# Patient Record
Sex: Male | Born: 1965 | Race: White | Hispanic: No | Marital: Single
Health system: Midwestern US, Academic
[De-identification: ages and names within clinical notes are randomized; demographics above are authoritative.]

---

## 2018-09-20 ENCOUNTER — Encounter: Admit: 2018-09-20 | Discharge: 2018-09-20

## 2018-09-20 ENCOUNTER — Encounter: Admit: 2018-09-20 | Discharge: 2018-09-21

## 2018-09-20 ENCOUNTER — Ambulatory Visit
Admit: 2018-09-21 | Discharge: 2018-09-23 | Disposition: A | Discharge status: Home / Self Care | Source: Other Acute Inpatient Hospital | Attending: Critical Care Medicine | Admitting: Critical Care Medicine

## 2018-09-20 DIAGNOSIS — S82201A Unspecified fracture of shaft of right tibia, initial encounter for closed fracture: Secondary | ICD-10-CM

## 2018-09-20 DIAGNOSIS — S0101XA Laceration without foreign body of scalp, initial encounter: Secondary | ICD-10-CM

## 2018-09-20 MED ORDER — ONDANSETRON HCL (PF) 4 MG/2 ML IJ SOLN
4 mg | INTRAVENOUS | 0 refills | Status: DC | PRN
Start: 2018-09-20 — End: 2018-09-23
  Administered 2018-09-21 – 2018-09-23 (×3): 4 mg via INTRAVENOUS

## 2018-09-20 MED ORDER — ENOXAPARIN 30 MG/0.3 ML SC SYRG
30 mg | Freq: Two times a day (BID) | SUBCUTANEOUS | 0 refills | Status: DC
Start: 2018-09-20 — End: 2018-09-23
  Administered 2018-09-21 – 2018-09-23 (×6): 30 mg via SUBCUTANEOUS

## 2018-09-20 MED ORDER — SENNOSIDES 8.6 MG PO TAB
1 | Freq: Two times a day (BID) | ORAL | 0 refills | Status: DC
Start: 2018-09-20 — End: 2018-09-23
  Administered 2018-09-21 – 2018-09-23 (×4): 1 via ORAL

## 2018-09-20 MED ORDER — ALBUTEROL SULFATE 90 MCG/ACTUATION IN HFAA
2 | RESPIRATORY_TRACT | 0 refills | Status: DC | PRN
Start: 2018-09-20 — End: 2018-09-23

## 2018-09-20 MED ORDER — FENTANYL CITRATE (PF) 50 MCG/ML IJ SOLN
25-50 ug | INTRAVENOUS | 0 refills | Status: DC | PRN
Start: 2018-09-20 — End: 2018-09-22
  Administered 2018-09-21: 15:00:00 50 ug via INTRAVENOUS
  Administered 2018-09-21: 11:00:00 25 ug via INTRAVENOUS
  Administered 2018-09-21 (×2): 50 ug via INTRAVENOUS
  Administered 2018-09-21 (×2): 25 ug via INTRAVENOUS

## 2018-09-20 MED ORDER — ACETAMINOPHEN 325 MG PO TAB
650 mg | ORAL | 0 refills | Status: DC | PRN
Start: 2018-09-20 — End: 2018-09-23
  Administered 2018-09-22 (×2): 650 mg via ORAL

## 2018-09-20 MED ORDER — MELATONIN 3 MG PO TAB
3 mg | Freq: Every evening | ORAL | 0 refills | Status: DC | PRN
Start: 2018-09-20 — End: 2018-09-23

## 2018-09-20 MED ORDER — LIDOCAINE-EPINEPHRINE 1 %-1:100,000 IJ SOLN
30 mL | Freq: Once | INTRAMUSCULAR | 0 refills | Status: CP
Start: 2018-09-20 — End: ?
  Administered 2018-09-21: 03:00:00 30 mL via INTRAMUSCULAR

## 2018-09-20 MED ORDER — FENTANYL CITRATE (PF) 50 MCG/ML IJ SOLN
25 ug | Freq: Once | INTRAVENOUS | 0 refills | Status: CP
Start: 2018-09-20 — End: ?
  Administered 2018-09-21: 05:00:00 25 ug via INTRAVENOUS

## 2018-09-20 MED ORDER — AMPICILLIN/SULBACTAM 3G/100ML NS IVPB (MB+)
3 g | INTRAVENOUS | 0 refills | Status: DC
Start: 2018-09-20 — End: 2018-09-21
  Administered 2018-09-21 (×6): 3 g via INTRAVENOUS

## 2018-09-20 MED ORDER — ONDANSETRON HCL 4 MG PO TAB
4 mg | ORAL | 0 refills | Status: DC | PRN
Start: 2018-09-20 — End: 2018-09-23

## 2018-09-20 MED ORDER — POLYETHYLENE GLYCOL 3350 17 GRAM PO PWPK
1 | Freq: Every day | ORAL | 0 refills | Status: DC | PRN
Start: 2018-09-20 — End: 2018-09-23
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
## 2018-09-21 ENCOUNTER — Encounter: Admit: 2018-09-21 | Discharge: 2018-09-21 | Attending: Critical Care Medicine | Admitting: Critical Care Medicine

## 2018-09-21 ENCOUNTER — Encounter: Admit: 2018-09-21 | Discharge: 2018-09-21

## 2018-09-21 ENCOUNTER — Encounter: Admit: 2018-09-20 | Discharge: 2018-09-20 | Attending: Critical Care Medicine | Admitting: Critical Care Medicine

## 2018-09-21 DIAGNOSIS — S82201B Unspecified fracture of shaft of right tibia, initial encounter for open fracture type I or II: Principal | ICD-10-CM

## 2018-09-21 LAB — COMPREHENSIVE METABOLIC PANEL
Lab: 141 MMOL/L (ref 137–147)
Lab: 20 MMOL/L — ABNORMAL LOW (ref 21–30)
Lab: 21 U/L (ref 7–56)
Lab: 31 U/L (ref 7–40)
Lab: >60 mL/min (ref >60)
Lab: >60 mL/min (ref >60)
Lab: 11 (ref 3–12)

## 2018-09-21 LAB — BASIC METABOLIC PANEL: Lab: 142 MMOL/L — ABNORMAL LOW (ref 137–147)

## 2018-09-21 LAB — CBC
Lab: 13 % — ABNORMAL HIGH (ref 11–15)
Lab: 15 K/UL — ABNORMAL HIGH (ref 4.5–11.0)
Lab: 247 10*3/uL (ref 150–400)
Lab: 8.8 FL (ref 7–11)
Lab: 8 K/UL — ABNORMAL LOW (ref 4.5–11.0)

## 2018-09-21 LAB — PHOSPHORUS
Lab: 2.9 mg/dL (ref 2.0–4.5)
Lab: 3.1 mg/dL (ref 2.0–4.5)

## 2018-09-21 LAB — PROTIME INR (PT): Lab: 1.2 M/UL (ref 0.8–1.2)

## 2018-09-21 LAB — LACTIC ACID(LACTATE): Lab: 1.1 MMOL/L (ref 0.5–2.0)

## 2018-09-21 LAB — CREATINE KINASE-CPK: Lab: 446 U/L — ABNORMAL HIGH (ref 35–232)

## 2018-09-21 LAB — MAGNESIUM
Lab: 2.1 mg/dL (ref 1.6–2.6)
Lab: 1.8 mg/dL — ABNORMAL HIGH (ref >60)

## 2018-09-21 LAB — COVID-19 (SARS-COV-2) PCR

## 2018-09-21 LAB — IONIZED CALCIUM: Lab: 1 MMOL/L — ABNORMAL HIGH (ref 1.0–1.3)

## 2018-09-21 LAB — PTT (APTT): Lab: 28 s (ref 24.0–36.5)

## 2018-09-21 MED ORDER — FAMOTIDINE 20 MG PO TAB
20 mg | Freq: Every day | ORAL | 0 refills | Status: DC
Start: 2018-09-21 — End: 2018-09-23
  Administered 2018-09-21 – 2018-09-23 (×3): 20 mg via ORAL

## 2018-09-21 MED ORDER — PHENYLEPHRINE IN 0.9% NACL(PF) 1 MG/10 ML (100 MCG/ML) IV SYRG
INTRAVENOUS | 0 refills | Status: DC
Start: 2018-09-21 — End: 2018-09-21
  Administered 2018-09-21 (×8): 100 ug via INTRAVENOUS
  Administered 2018-09-21: 20:00:00 50 ug via INTRAVENOUS

## 2018-09-21 MED ORDER — POTASSIUM CHLORIDE IN WATER 10 MEQ/50 ML IV PGBK
10 meq | INTRAVENOUS | 0 refills | Status: DC
Start: 2018-09-21 — End: 2018-09-21
  Administered 2018-09-21: 12:00:00 10 meq via INTRAVENOUS

## 2018-09-21 MED ORDER — DEXTRAN 70-HYPROMELLOSE (PF) 0.1-0.3 % OP DPET
0 refills | Status: DC
Start: 2018-09-21 — End: 2018-09-21
  Administered 2018-09-21: 19:00:00 2 [drp] via OPHTHALMIC

## 2018-09-21 MED ORDER — VANCOMYCIN 1,000 MG IV SOLR
0 refills | Status: DC
Start: 2018-09-21 — End: 2018-09-21
  Administered 2018-09-21: 22:00:00 1 g via TOPICAL

## 2018-09-21 MED ORDER — PROPOFOL INJ 10 MG/ML IV VIAL
0 refills | Status: DC
Start: 2018-09-21 — End: 2018-09-21
  Administered 2018-09-21: 19:00:00 100 mg via INTRAVENOUS

## 2018-09-21 MED ORDER — CEFAZOLIN 1 GRAM IJ SOLR
0 refills | Status: DC
Start: 2018-09-21 — End: 2018-09-21
  Administered 2018-09-21: 19:00:00 2 g via INTRAVENOUS

## 2018-09-21 MED ORDER — SUCCINYLCHOLINE CHLORIDE 20 MG/ML IJ SOLN
INTRAVENOUS | 0 refills | Status: DC
Start: 2018-09-21 — End: 2018-09-21
  Administered 2018-09-21: 19:00:00 120 mg via INTRAVENOUS

## 2018-09-21 MED ORDER — HYDROMORPHONE (PF) 2 MG/ML IJ SYRG
0 refills | Status: DC
Start: 2018-09-21 — End: 2018-09-21
  Administered 2018-09-21 (×3): 0.5 mg via INTRAVENOUS
  Administered 2018-09-21 (×2): .25 mg via INTRAVENOUS

## 2018-09-21 MED ORDER — LACTATED RINGERS IV SOLP
INTRAVENOUS | 0 refills | Status: AC
Start: 2018-09-21 — End: ?
  Administered 2018-09-21 (×3): 1000.000 mL via INTRAVENOUS

## 2018-09-21 MED ORDER — HYDROMORPHONE (PF) 2 MG/ML IJ SYRG
.5 mg | Freq: Once | INTRAVENOUS | 0 refills | Status: DC
Start: 2018-09-21 — End: 2018-09-22

## 2018-09-21 MED ORDER — DEXAMETHASONE SODIUM PHOSPHATE 4 MG/ML IJ SOLN
INTRAVENOUS | 0 refills | Status: DC
Start: 2018-09-21 — End: 2018-09-21
  Administered 2018-09-21: 21:00:00 4 mg via INTRAVENOUS

## 2018-09-21 MED ORDER — SUGAMMADEX 100 MG/ML IV SOLN
INTRAVENOUS | 0 refills | Status: DC
Start: 2018-09-21 — End: 2018-09-21
  Administered 2018-09-21: 22:00:00 165 mg via INTRAVENOUS

## 2018-09-21 MED ORDER — DEXMEDETOMIDINE# 4MCG/ML IV SOLN
0 refills | Status: DC
Start: 2018-09-21 — End: 2018-09-21
  Administered 2018-09-21: 22:00:00 4 ug via INTRAVENOUS
  Administered 2018-09-21: 22:00:00 8 ug via INTRAVENOUS

## 2018-09-21 MED ORDER — AMPICILLIN/SULBACTAM 3G/100ML NS IVPB (MB+)
3 g | INTRAVENOUS | 0 refills | Status: CP
Start: 2018-09-21 — End: ?
  Administered 2018-09-22 (×8): 3 g via INTRAVENOUS

## 2018-09-21 MED ORDER — PROMETHAZINE 25 MG/ML IJ SOLN
6.25 mg | INTRAVENOUS | 0 refills | Status: DC | PRN
Start: 2018-09-21 — End: 2018-09-22

## 2018-09-21 MED ORDER — LIDOCAINE (PF) 200 MG/10 ML (2 %) IJ SYRG
0 refills | Status: DC
Start: 2018-09-21 — End: 2018-09-21
  Administered 2018-09-21: 19:00:00 50 mg via INTRAVENOUS

## 2018-09-21 MED ORDER — ACETAMINOPHEN 500 MG PO TAB
1000 mg | Freq: Once | ORAL | 0 refills | Status: CP
Start: 2018-09-21 — End: ?
  Administered 2018-09-21: 1000 mg via ORAL

## 2018-09-21 MED ORDER — CALCIUM GLUCONATE 1GM/100ML NS MB+
1 g | Freq: Once | INTRAVENOUS | 0 refills | Status: CP
Start: 2018-09-21 — End: ?
  Administered 2018-09-21 (×2): 1 g via INTRAVENOUS

## 2018-09-21 MED ORDER — OXYCODONE 5 MG PO TAB
5-10 mg | ORAL | 0 refills | Status: DC | PRN
Start: 2018-09-21 — End: 2018-09-21
  Administered 2018-09-21: 14:00:00 5 mg via ORAL

## 2018-09-21 MED ORDER — FENTANYL CITRATE (PF) 50 MCG/ML IJ SOLN
25 ug | INTRAVENOUS | 0 refills | Status: DC | PRN
Start: 2018-09-21 — End: 2018-09-22
  Administered 2018-09-21 (×4): 25 ug via INTRAVENOUS

## 2018-09-21 MED ORDER — MIDAZOLAM 1 MG/ML IJ SOLN
INTRAVENOUS | 0 refills | Status: DC
Start: 2018-09-21 — End: 2018-09-21
  Administered 2018-09-21: 18:00:00 2 mg via INTRAVENOUS

## 2018-09-21 MED ORDER — DEXAMETHASONE SODIUM PHOSPHATE 4 MG/ML IJ SOLN
4 mg | Freq: Once | INTRAVENOUS | 0 refills | Status: DC | PRN
Start: 2018-09-21 — End: 2018-09-22

## 2018-09-21 MED ORDER — MEPERIDINE (PF) 25 MG/ML IJ SYRG
12.5 mg | INTRAVENOUS | 0 refills | Status: DC | PRN
Start: 2018-09-21 — End: 2018-09-22

## 2018-09-21 MED ORDER — MAGNESIUM SULFATE IN WATER 4 GRAM/50 ML (8 %) IV PGBK
4 g | Freq: Once | INTRAVENOUS | 0 refills | Status: CP
Start: 2018-09-21 — End: ?
  Administered 2018-09-21: 12:00:00 4 g via INTRAVENOUS

## 2018-09-21 MED ORDER — ROCURONIUM 10 MG/ML IV SOLN
INTRAVENOUS | 0 refills | Status: DC
Start: 2018-09-21 — End: 2018-09-21
  Administered 2018-09-21 (×3): 10 mg via INTRAVENOUS
  Administered 2018-09-21: 19:00:00 40 mg via INTRAVENOUS

## 2018-09-21 MED ORDER — OXYCODONE 5 MG PO TAB
5-15 mg | ORAL | 0 refills | Status: DC | PRN
Start: 2018-09-21 — End: 2018-09-23
  Administered 2018-09-21 (×2): 5 mg via ORAL
  Administered 2018-09-22 (×4): 10 mg via ORAL
  Administered 2018-09-23 (×3): 15 mg via ORAL

## 2018-09-21 MED ORDER — OXYCODONE 5 MG PO TAB
5-10 mg | Freq: Once | ORAL | 0 refills | Status: CP | PRN
Start: 2018-09-21 — End: ?
  Administered 2018-09-21: 23:00:00 10 mg via ORAL

## 2018-09-21 MED ORDER — POTASSIUM CHLORIDE 20 MEQ PO TBTQ
40 meq | Freq: Once | ORAL | 0 refills | Status: CP
Start: 2018-09-21 — End: ?
  Administered 2018-09-21: 15:00:00 40 meq via ORAL

## 2018-09-21 NOTE — Progress Notes
Brief Ortho Update Note    - OR today for IMN R tibia  - posted, consented, marked  - NPO  - continues to be comfortable on exam        Calvin Morris  [id number]

## 2018-09-21 NOTE — Patient Instructions
How Your Back Works  A healthy back lets you bend and stretch without pain. The spine has???3 natural curves. These keep your body balanced. Strong, flexible muscles support your spine. Soft, cushioning disks separate the hard bones of your spine. The disks let your spine???bend and move.    The parts of the spine  ??? The vertebrae are the 24 bones that make up the spine.  ??? The spinous process is the part of each vertebra you can feel through your skin.  ??? Each of these bones has a central hole that runs top to bottom. Together these holes form a tunnel called the spinal canal.  ??? The lamina of each vertebra forms the back of the spinal canal.  ??? Running through the canal are nerves. They are attached in a bundle called the spinal cord for most of the canal.  ??? A foramen is a small opening where a spinal???nerve root leaves the spinal canal.  ??? Disks serve as cushions between vertebrae. A disk???s soft center absorbs shock during movement.     Two vertebrae and a disk     The supporting muscles  Strong, flexible muscles help maintain your 3 natural curves. They hold your spine in???correct alignment. This helps support your upper body. Strong core muscles help take the strain off your back. These include the???stomach, buttock, and thigh muscles.  StayWell last reviewed this educational content on 02/29/2016  ??? 2000-2020 [employer], [place]. [address], [place], [place] [address]. All rights reserved. This information is not intended as a substitute for professional medical care. Always follow your healthcare professional's instructions.        How Bones Heal  Bone is living tissue made up of cells. When a bone breaks, cells in the blood rush to the fractured area. These cells help grow???new bone. Bones heal through a gradual process called remodeling. The length of this process depends on general health, age, the type of fracture and how well the injury is cared for. The new bone grows stronger, even after a cast is removed. The fracture callus shrinks and remodels as the bone is used.      The fibers are replaced by new bone. At first, the new bone is weak and spongy. This is called a fracture callus.      Cells form a network of strong fibers inside the blood clot. These fibers hold bone fragments together.      Tissues bleed around the fracture. This forms a blood clot in the space between bone fragments.   StayWell last reviewed this educational content on 06/28/2016  ??? 2000-2020 [employer], [place]. [address] [place], [place] [address]. All rights reserved. This information is not intended as a substitute for professional medical care. Always follow your healthcare professional's instructions.        Laceration, All Closures  A???laceration is a cut through the skin. This will usually require stitches or staples if it's deep. Minor cuts may be treated with a surgical tape closure or???skin glue.     Home care  ??? Your healthcare provider may prescribe an antibiotic. This is to help prevent infection. Follow all instructions for taking this medicine. Take the medicine every day until it's gone or you are told to stop. You should not have any left over.  ??? The healthcare provider may prescribe medicines for pain. If no pain medicines were prescribed, you can use over-the-counter pain medicines. Follow instructions for taking any pain  medicines. Talk with your healthcare provider before using these medicines if you have chronic liver or kidney disease, or ever had a stomach ulcer or digestive bleeding.  ??? Follow the healthcare provider???s instructions on how to care for the cut.  ??? Keep the wound clean and dry. Don't get the wound wet until you are told it's OK to do so.???If the area gets wet, gently pat it dry with a clean cloth. Replace the wet bandage with a dry one.  ??? If a bandage was applied and it becomes wet or dirty, replace it. Otherwise, leave it in place for the first 24 hours.  ??? Caring for stitches or staples: Once you no longer need to keep them dry, clean the wound daily. First, remove the bandage. Then wash the area gently with soap and clean running water, or as directed by the???healthcare provider. Use a wet cotton swab to loosen and remove any blood or crust that forms. After cleaning, apply a thin layer of antibiotic ointment if advised. Then put on a new bandage unless you are told not to.  ??? Caring for skin glue: Don???t put apply liquid, ointment, or cream on the wound while the glue is in place.???Don't do activities that cause heavy sweating. Protect the wound from sunlight.???Don't scratch, rub, or pick at the adhesive film. Don't place tape directly over the film.???The glue should peel off naturally within 5 to 10 days.???  ??? Caring for surgical tape: Keep the area dry. If it gets wet, blot it dry with a clean towel. Surgical tape usually falls off within 7 to 10 days. If it has not fallen off after 10 days, you can take it off yourself. Put mineral oil or petroleum jelly on a cotton ball and gently rub the tape until it's removed.  ??? Once you can get the wound wet, you may shower as usual but don't soak the wound in water (no tub baths or swimming).  ??? Even with proper treatment, a wound infection may sometimes occur. Check the wound daily for signs of infection listed below.  Scalp wounds  During the first 2 days, you may carefully rinse your hair in the shower to remove blood, glass or dirt particles. After 2 days, you may shower and shampoo your hair normally. Don't soak your scalp in the tub or go swimming until the stitches or staples have been removed. Talk with your healthcare provider before applying any antibiotic ointment to the wound.   Mouth wounds  Eat soft foods to reduce pain. If the cut is inside of your mouth, clean by rinsing after each meal and at bedtime with a mixture of equal parts water and hydrogen peroxide (don't swallow!). Or you can use a cotton swab to directly apply hydrogen peroxide onto the cut. You may also be prescribed a chlorhexidine solution to rinse with. Mouth wounds can be painful when eating. You may use an over-the-counter local numbing solution for pain relief. If this is not available, you may use any numbing solution intended for teething babies. You may apply this directly to the sores with a cotton-tip swab or with your clean finger.   Follow-up care  Follow up with your healthcare provider as advised.???Ask your healthcare provider how long stitches should be left in place. Be sure to return for stitch removal as directed. If dissolving stitches were used in the mouth, these should fall out or dissolve without the need for removal. If tape closures were used, remove them yourself  when your provider recommends if they have not fallen off on their own. If???skin glue was used, the film will wear off by itself. Generally, you should keep healing wounds out of direct sunlight for the first couple of months to try to lessen scarring.   When to seek medical advice  Call your healthcare provider right away???if any of these occur:  ??? Signs of infection, including increasing pain in the wound, increasing wound redness or swelling, or pus or bad odor coming from the wound  ??? Fever of???100.4???F (38.???C)???or higher , or as directed by your healthcare provider  ??? Stitches or staples come apart or fall out or surgical tape falls off before 7 days and the wound appears to be reopening  ??? Wound edges reopen  ??? Wound changes colors  ??? Numbness around the wound after any numbing medicine should have worn off  ??? Decreased movement around the injured area  Call 911  Call 911???if you can't control the wound bleeding???with direct pressure.   StayWell last reviewed this educational content on 09/28/2017  ??? 2000-2020 [employer], [place]. [address], [place], [place] [address]. All rights reserved. This information is not intended as a substitute for professional medical care. Always follow your healthcare professional's instructions.        Leg Fracture    You have a break (fracture) of the leg. A fracture is treated with a splint, cast, or special boot. It will usually take at about 8 to 12 weeks for the fracture to heal, but it can take several months in some cases. If you have a severe injury, you may need surgery to fix it.  Home care  Follow these guidelines when caring for yourself at home:  ??? You will be given a splint, cast, boot, or other device to keep the injured area from moving. Unless you were told otherwise, use crutches or a walker. Don???t put weight on the injured leg until your healthcare provider says you can do so. (You can rent crutches and a walker at many pharmacies and surgical or orthopedic supply stores.)  ??? Keep your leg elevated to reduce pain and swelling. When sleeping, put a pillow under the injured leg. When sitting, support the injured leg so it is above your heart. This is very important during the first 2 days (48 hours).  ??? Put an ice pack on the injured area. Do this for 20 minutes every 1 to 2 hours the first day for pain relief. You can make an ice pack by wrapping a plastic bag of ice cubes in a thin towel. As the ice melts, be careful that the cast, splint, or boot doesn???t get wet. You can put the ice pack directly over the splint or cast. Continue using the ice pack 3 to 4 times a day for the next 2 days. Then use the ice pack as needed to ease pain and swelling.  ??? Keep the cast, splint, or boot completely dry at all times. Bathe with your cast, splint, or boot out of the water. Protect it with a large plastic bag, rubber-banded or taped at the top end. If a boot or fiberglass cast or splint gets wet, you can dry it with a hair dryer.  ??? You may use acetaminophen or ibuprofen to control pain, unless another pain medicine was prescribed. If you have chronic liver or kidney disease, talk with your healthcare provider before using these medicines. Also talk with your provider if you???ve  had a stomach ulcer or gastrointestinal bleeding.  ??? Don???t put creams or objects under the cast if you have itching.  Follow-up care  Follow up with your healthcare provider as advised. This is to make sure the bone is healing the way it should. If a splint was put on, it may be converted to a cast at your next visit.  X-rays may be taken. You will be told of any new findings that may affect your care.  When to seek medical advice  Call your healthcare provider right away if any of these occur:  ??? The cast or splint cracks  ??? The plaster cast or splint becomes wet or soft  ??? The fiberglass cast or splint stays wet for more than 24 hours  ??? Bad odor from the cast or wound fluid stains the cast  ??? Tightness or pain under the cast or splint gets worse  ??? Toes become swollen, cold, blue, numb, or tingly  ??? You can???t move your toes  ??? Skin around cast or splint becomes red or irritated  ??? Fever of 100.4???F (38???C) or higher, chills oras directed by your healthcare provider  StayWell last reviewed this educational content on 04/01/2015  ??? 2000-2020 [employer], [place]. [address] [place], [place] [address]. All rights reserved. This information is not intended as a substitute for professional medical care. Always follow your healthcare professional's instructions.        Medicine for Pain  Medicines can help to block pain, decrease inflammation, and treat related problems. More than???one medicine may be used???to treat your pain. Medicines may be changed as you feel better, or if they cause side effects.   Medicines Examples What they do Possible side effects   Non-opioid NSAIDs aspirin, acetaminophen Reduce pain chemicals at the site of pain.???NSAIDs can???reduce joint and soft tissue inflammation.  Nausea, stomach pain, ulcers, indigestion, bleeding, kidney, and liver problems. Certain NSAIDs may increase the risk for cardiovascular disease in some people. Talk with your healthcare provider.    Opioids  morphine and similar medicines, often called narcotics  Reduce feelings or perception of pain. Used for moderate to severe pain.  Nausea, vomiting, itching,???drowsiness, constipation, slowed breathing    Other medicines  corticosteroids, antinausea, antidepressants, antiseizure medicines  Reduce swelling, burning or tingling pain, or???certain side effects of pain medicines, such as nausea or vomiting  Your healthcare provider will explain the possible side effects of these medicines.    Anesthetics (local, injected)  lidocaine, benzocaine, and medicines used by anesthesiologists  Stop pain signals from reaching the brain by blocking feeling in the treated area  Nausea, low blood pressure, fever, slowed breathing, fainting, seizures, heart attack    When to call your healthcare provider  Call your healthcare provider right away (or have a family member call) if you have:  ??? Unrelieved pain  ??? Side effects, including constipation or uncontrolled nausea, that interfere with daily activities  If you have extreme sleepiness or breathing problems, call 911.   Other precautions  ??? Ask your healthcare provider or pharmacist how to get rid of your pain medicines safely when you stop using them.  ??? Never share your pain medicines with anyone.  ??? Store your medicines in a safe place so they can???t be stolen. If you think your medicine has been stolen or lost, tell your healthcare provider right away.    StayWell last reviewed this educational content on 12/29/2017  ??? 2000-2020 [employer], [place]. [address], [place] [address]. All rights reserved. This information is not intended as a substitute for professional medical care. Always follow your healthcare professional's instructions.

## 2018-09-21 NOTE — Other
Brief Operative Note    Name: Calvin Morris is a 53 y.o. male     DOB: [birth year]             MRN#: [id number]  DATE OF OPERATION: 09/21/2018    Date:  09/21/2018        Preoperative Dx:   Closed fracture of right tibia and fibula, initial encounter [S82.201A, S82.401A]    Post-op Diagnosis      * Closed fracture of right tibia and fibula, initial encounter [S82.201A, S82.401A]    Procedure(s) (LRB):  TREATMENT TIBIAL SHAFT FRACTURE WITH INTRAMEDULLARY IMPLANT WITH INTERLOCKING SCREWS/ CERCLAGE (Right)    Anesthesia Type: General    Surgeon(s) and Role:     * Ree Shay, MD - Primary     * Frances Maywood, MD - Resident - Assisting     * Mersereau, Benancio Deeds, DO - Resident - Assisting      Findings:  Comminuted tibial shaft fx    Estimated Blood Loss: 200 ml    Specimen(s) Removed/Disposition: * No specimens in log *    Complications:  None    Implants:   Implant Name Type Inv. Item Serial No. Manufacturer Lot No. LRB No. Used   NAIL 35CM TIBIAL TRIGEN META-NAIL INTRAMEDULLARY - SNA Nail NAIL 35CM TIBIAL TRIGEN META-NAIL INTRAMEDULLARY NA [employer] [id number] Right 1   GRAFT BONE  INFUSE SMALL SPINE BOVINE COLLAGEN - SNA  GRAFT BONE  INFUSE SMALL SPINE BOVINE COLLAGEN NA [employer] [id number] Right 1   SCREW BONE  FEMUR LOW PROFILE INTERNAL HEX TRIGEN - SNA Screw SCREW BONE  FEMUR LOW PROFILE INTERNAL HEX TRIGEN NA [employer] [id number] Right 1   SCREW BONE 52. FEMUR LOW PROFILE INTERNAL HEX TRIGEN - SNA Screw SCREW BONE 52. FEMUR LOW PROFILE INTERNAL HEX TRIGEN NA [employer] [id number] Right 1   SCREW BONE  FEMUR LOW PROFILE INTERNAL HEX TRIGEN - SNA Screw SCREW BONE  FEMUR LOW PROFILE INTERNAL HEX TRIGEN NA [employer] [id number] Right 1   SCREW BONE 4.3MM 27.5MM TIBIA LOW PROFILE INTERNAL HEX - SNA Screw SCREW BONE 4.3MM 27.5MM TIBIA LOW PROFILE INTERNAL HEX NA [employer] [id number] Right 1   SCREW BONE 2.7MM 4.5MM T7 CORTEX SELF RETAINING - SNA Screw SCREW BONE 2.7MM 4.5MM T7 CORTEX SELF RETAINING NA [employer] NA Right 1   SCREW BONE 2.7MM 4.5MM T7 CORTEX SELF RETAINING - SNA Screw SCREW BONE 2.7MM 4.5MM T7 CORTEX SELF RETAINING NA [employer] NA Right 1   3.5MM LOCKING 1/3 TUBULAR PLATE, 6HOLE Plate  NA [employer] ORTHO NA Right 1         Drains: None    Disposition:  PACU - stable    Frances Maywood, MD  Pager

## 2018-09-21 NOTE — Progress Notes
Patient arrived to room # (2655) via cart accompanied by EMS. Patient transferred to the bed with assistance. Bedside safety checks completed. Initial patient assessment completed. Refer to flowsheet for details.    Admission skin assessment completed with: Cummiskey, MD    Pressure injury present on arrival?: No    1. Head/Face/Neck: No  2. Trunk/Back: No  3. Upper Extremities: No  4. Lower Extremities: No  5. Pelvic/Coccyx: No  6. Assessed for device associated injury? Yes  7. Malnutrition Screening Tool (Nursing Nutrition Assessment) Completed? Yes    See Doc Flowsheet for additional wound details.     INTERVENTIONS:     q2h turns

## 2018-09-21 NOTE — Progress Notes
ORTHOTICS/PROSTHETICS  Consult Note:      NAME: Calvin Morris  ADMISSION DATE: admitted to hospital  DOB: January 31, 1966  AGE: 53 y.o.  ROOM: [id number]/04  DOCTOR:     Date of Order: 09/21/18  Date of Service: 09/21/18    Services referred for: Orthotic Eval and Treat: TLSO pre-fabricated    Description of condition/injury, including services:L4 Compression Fx    Size: Unv Cyberspine TLSO   Side n/a      Measurements: Circumference  Waist 33"    Functional Goals discussed for device use:  Joint stabilization (support and alignment)  Increase or decrease range of motion  Facilitate healing of injury  Decrease pain    Device is to be ordered No    Estimated date of delivery Today    Functional goals met: TLSO delivered to pt's room for use when out of bed.     The patient states satisfaction with the fit/function of device: n/a    Additional supplies provided to the patient: None    Patient Education:  Written and/or verbal instruction/information provided to Patient  Information provided: device function, usage/break-in period, donning/doffing of device, care and cleaning and benefits and precautions      Patient did tolerate the procedure without incident/problem.    Follow-up scheduled: PRN    PLAN:  Order completed    [name]  09/21/2018
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
## 2018-09-21 NOTE — Progress Notes
Ortho Update    Checked on patient x2 since originally seeing around midnight.  He continues to sleep.  No increase requirements in pain meds and pain very well controlled.  No change in exam.  Will continue to monitor closely.    Evonnie Dawes, MD  [phone]
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
## 2018-09-21 NOTE — Discharge Instructions - Supplementary Instructions
Prescription Drug Discount Resource  -GoodRx  GoodRx gathers current prices and discounts to help you find the lowest cost pharmacy for your prescriptions. GoodRx is 100% free. No personal information required.  https://www.goodrx.com or go to m.goodrx.com from any mobile phone.     -[place] Residents  As a resident of [place], you and your family have access to a statewide Prescription Assistance Program (PAP)  Https://www.[place]drugcard.com/    -[place] Residents  The [place] Rx Card is a free prescription discount card provided by township, city and county governments  and by local [employer] organizations.  http://www.coast2coastrx.com/    Please set up a follow up appointment with a Primary Care Physician (PCP) at [facility]; the following are options available in your area.     These are clinic serving the uninsured and underinsured patients in your community.
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
## 2018-09-21 NOTE — Progress Notes
RT Adult Assessment Note    NAME:Calvin Morris             MRN: [id number]             DOB:1965/04/19          AGE: 53 y.o.  ADMISSION DATE: 09/20/2018             DAYS ADMITTED: LOS: 0 days    RT Treatment Plan:  Protocol Plan: Medications  Albuterol: MDI PRN(pt states takes at home prn for SOB)    Protocol Plan: Procedures  PAP: Place a nursing order for "IS Q1h While Awake" for any of Lung Expansion indicators    Additional Comments:  Impressions of the patient: pt was non labored in bed  Intervention(s)/outcome(s): none  Patient education that was completed: none  Recommendations to the care team: continue to monitor if prn is indicated  Vital Signs:  Pulse: 80  RR: 13 PER MINUTE  SpO2: 98 %  O2 Device:    Liter Flow:    O2%:    Breath Sounds: Clear (Implies normal)  Respiratory Effort: Non-Labored

## 2018-09-21 NOTE — Progress Notes
PHYSICAL THERAPY  NOTE      Name: Calvin Morris        MRN: [id number]          DOB: 1965-03-16          Age: 53 y.o.  Admission Date: 09/20/2018             LOS: 1 day      Patient awaiting OR today for ex-fix placement to R LE.  Therapy will follow up 7/25.    Therapist: Jerilynn Birkenhead, PT  Date: 09/21/2018
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
## 2018-09-22 LAB — PHOSPHORUS: Lab: 2.6 mg/dL — ABNORMAL LOW (ref 2.0–4.5)

## 2018-09-22 LAB — CBC
Lab: 12 K/UL — ABNORMAL HIGH (ref 4.5–11.0)
Lab: 13 % — ABNORMAL LOW (ref 11–15)
Lab: 214 K/UL (ref >60)
Lab: 31 pg (ref 26–34)
Lab: 34 % — ABNORMAL LOW (ref 40–50)
Lab: 35 g/dL (ref 32.0–36.0)
Lab: 8.5 FL (ref >60)
Lab: 89 FL — ABNORMAL HIGH (ref 80–100)

## 2018-09-22 LAB — BASIC METABOLIC PANEL: Lab: 137 MMOL/L (ref 137–147)

## 2018-09-22 LAB — MAGNESIUM: Lab: 2.1 mg/dL — ABNORMAL LOW (ref 1.6–2.6)

## 2018-09-22 MED ORDER — BACITRACIN ZINC 500 UNIT/GRAM TP OINT
Freq: Two times a day (BID) | TOPICAL | 0 refills | Status: DC
Start: 2018-09-22 — End: 2018-09-23
  Administered 2018-09-22: 20:00:00 via TOPICAL

## 2018-09-22 MED ORDER — CALCIUM CARBONATE 200 MG CALCIUM (500 MG) PO CHEW
500 mg | ORAL | 0 refills | Status: DC | PRN
Start: 2018-09-22 — End: 2018-09-23
  Administered 2018-09-22 – 2018-09-23 (×2): 500 mg via ORAL

## 2018-09-22 NOTE — Progress Notes
ORTHOTICS/PROSTHETICS  Consult Note:      NAME: Calvin Morris  ADMISSION DATE: admitted to hospital  DOB: 29-Sep-1965  AGE: 53 y.o.  ROOM: [id number]  DOCTOR:     Date of Order: 7/24  Date of Service: 7/25    Services referred for: Orthotic Eval and Treat: Ankle night splint    Description of condition/injury, including services:Trauma    Size: large Side right      Measurements: Area/circumference/Diameter/Length na    Functional Goals discussed for device use:  Joint stabilization (support and alignment)    Device is to be ordered no    Estimated date of delivery delivered 7/25    Functional goals met: Yes    The patient states satisfaction with the fit/function of device: Yes    Additional supplies provided to the patient: no    Patient Education:  Written and/or verbal instruction/information provided to Patient, Caregiver and Hospital staff  Information provided: device function, usage/break-in period, safety issues, donning/doffing of device, how/whom to report problems related to device/change in physical condition, hospital staff provided instructions, care and cleaning, fitting issues, benefits and precautions and skin inspection      Patient did tolerate the procedure without incident/problem.    Follow-up scheduled: Patient fit with large nightsplint to right side. To be worn while in bed or sitting. To follow up as needed.     PLAN:  Order completed    [facility]  09/22/2018

## 2018-09-22 NOTE — Progress Notes
Patient arrived to room # [id number] via bed accompanied by transport. Patient transferred to the bed with assistance. Bedside safety checks completed. Initial patient assessment completed. Refer to flowsheet for details.    Admission skin assessment completed with: Lennette Bihari, RN and Jerene Pitch, RN     Pressure injury present on arrival?: No    1. Head/Face/Neck: No  2. Trunk/Back: No  3. Upper Extremities: No  4. Lower Extremities: No  5. Pelvic/Coccyx: No  6. Assessed for device associated injury? Yes  7. Malnutrition Screening Tool (Nursing Nutrition Assessment) Completed? No    See Doc Flowsheet for additional wound details.     INTERVENTIONS:
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
## 2018-09-22 NOTE — Anesthesia Post-Procedure Evaluation
Post-Anesthesia Evaluation    Name: Calvin Morris      MRN: [id number]     DOB: [birth year]     Age: 53 y.o.     Sex: male   __________________________________________________________________________     Procedure Information     Anesthesia Start Date/Time:  09/21/18 1329    Procedure:  TREATMENT TIBIAL SHAFT FRACTURE WITH INTRAMEDULLARY IMPLANT WITH INTERLOCKING SCREWS/ CERCLAGE (Right Leg Lower)    Location:  MAIN OR 09 / Main OR/Periop    Surgeon:  Leta Speller, MD          Post-Anesthesia Vitals  BP: 139/91 (07/24 1930)  Pulse: 55 (07/24 1930)  Respirations: 12 PER MINUTE (07/24 1930)  SpO2: 100 % (07/24 1930)  SpO2 Pulse: 54 (07/24 1930)   Vitals Value Taken Time   BP 139/91 09/21/2018  7:30 PM   Temp 36.4 C (97.5 F) 09/21/2018  5:30 PM   Pulse 55 09/21/2018  7:30 PM   Respirations 12 PER MINUTE 09/21/2018  7:30 PM   SpO2 100 % 09/21/2018  7:30 PM         Post Anesthesia Evaluation Note    Evaluation location: Pre/Post  Patient participation: recovered; patient participated in evaluation  Level of consciousness: alert    Pain score: Pain scale: Per pt, tolerable after receiving fent, oxy, and tylenol; resting comfortably.  Pain management: adequate    Hydration: normovolemia  Temperature: 36.0C - 38.4C  Airway patency: adequate    Perioperative Events       Post-op nausea and vomiting: no PONV    Postoperative Status  Cardiovascular status: hemodynamically stable  Respiratory status: spontaneous ventilation  Follow-up needed: none        Perioperative Events  Perioperative Event: No  Emergency Case Activation: No
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
## 2018-09-23 ENCOUNTER — Encounter: Admit: 2018-09-21 | Discharge: 2018-09-21 | Attending: Critical Care Medicine | Admitting: Critical Care Medicine

## 2018-09-23 ENCOUNTER — Encounter: Admit: 2018-09-23 | Discharge: 2018-09-23

## 2018-09-23 ENCOUNTER — Encounter: Admit: 2018-09-20 | Discharge: 2018-09-20 | Attending: Critical Care Medicine | Admitting: Critical Care Medicine

## 2018-09-23 DIAGNOSIS — F17210 Nicotine dependence, cigarettes, uncomplicated: Secondary | ICD-10-CM

## 2018-09-23 DIAGNOSIS — G8911 Acute pain due to trauma: Secondary | ICD-10-CM

## 2018-09-23 DIAGNOSIS — S0101XA Laceration without foreign body of scalp, initial encounter: Secondary | ICD-10-CM

## 2018-09-23 DIAGNOSIS — R402413 Glasgow coma scale score 13-15, at hospital admission: Secondary | ICD-10-CM

## 2018-09-23 DIAGNOSIS — S060X9A Concussion with loss of consciousness of unspecified duration, initial encounter: Secondary | ICD-10-CM

## 2018-09-23 DIAGNOSIS — S32040A Wedge compression fracture of fourth lumbar vertebra, initial encounter for closed fracture: Secondary | ICD-10-CM

## 2018-09-23 DIAGNOSIS — K219 Gastro-esophageal reflux disease without esophagitis: Secondary | ICD-10-CM

## 2018-09-23 DIAGNOSIS — S82401B Unspecified fracture of shaft of right fibula, initial encounter for open fracture type I or II: Secondary | ICD-10-CM

## 2018-09-23 DIAGNOSIS — S82201B Unspecified fracture of shaft of right tibia, initial encounter for open fracture type I or II: Principal | ICD-10-CM

## 2018-09-23 DIAGNOSIS — Z1159 Encounter for screening for other viral diseases: Secondary | ICD-10-CM

## 2018-09-23 LAB — PHOSPHORUS: Lab: 1.7 mg/dL — ABNORMAL LOW (ref >60)

## 2018-09-23 LAB — MAGNESIUM: Lab: 1.8 mg/dL — ABNORMAL LOW (ref 1.6–2.6)

## 2018-09-23 LAB — BASIC METABOLIC PANEL: Lab: 135 MMOL/L — ABNORMAL LOW (ref 137–147)

## 2018-09-23 LAB — CBC: Lab: 11 K/UL — ABNORMAL HIGH (ref 4.5–11.0)

## 2018-09-23 MED ORDER — BACITRACIN ZINC 500 UNIT/GRAM TP OINT
Freq: Two times a day (BID) | 0 refills | 14.00000 days | Status: CN
Start: 2018-09-23 — End: ?

## 2018-09-23 MED ORDER — ASPIRIN 81 MG PO CHEW
81 mg | ORAL_TABLET | Freq: Two times a day (BID) | ORAL | 0 refills | Status: CN
Start: 2018-09-23 — End: ?

## 2018-09-23 MED ORDER — DIAZEPAM 5 MG PO TAB
5 mg | Freq: Once | ORAL | 0 refills | Status: CP
Start: 2018-09-23 — End: ?
  Administered 2018-09-23: 15:00:00 5 mg via ORAL

## 2018-09-23 MED ORDER — OXYCODONE 5 MG PO TAB
5-15 mg | ORAL_TABLET | ORAL | 0 refills | 6.00000 days | Status: DC | PRN
Start: 2018-09-23 — End: 2018-10-18
  Filled 2018-09-23: qty 40, 3d supply, fill #1

## 2018-09-23 MED ORDER — POTASSIUM, SODIUM PHOSPHATES 280-160-250 MG PO PWPK
2 | Freq: Once | ORAL | 0 refills | Status: CP
Start: 2018-09-23 — End: ?
  Administered 2018-09-23: 15:00:00 500 mg via ORAL

## 2018-09-23 MED ORDER — POLYETHYLENE GLYCOL 3350 17 GRAM PO PWPK
17 g | Freq: Every day | ORAL | 0 refills | 18.00000 days | Status: AC | PRN
Start: 2018-09-23 — End: ?

## 2018-09-23 MED ORDER — SENNOSIDES 8.6 MG PO TAB
1 | ORAL_TABLET | Freq: Two times a day (BID) | ORAL | 0 refills | Status: AC
Start: 2018-09-23 — End: ?

## 2018-09-23 MED ORDER — ACETAMINOPHEN 325 MG PO TAB
650 mg | ORAL | 0 refills | Status: AC | PRN
Start: 2018-09-23 — End: ?
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
## 2018-09-23 NOTE — Care Plan
IV site removed with cannula intact.  Reviewed all discharge instructions with patient and patient's significant other.  Patient left with all personal belongings.  Oxycodone provided by outpatient pharmacy.
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
## 2018-09-24 ENCOUNTER — Encounter: Admit: 2018-09-24 | Discharge: 2018-09-24

## 2018-09-24 DIAGNOSIS — S060X1A Concussion with loss of consciousness of 30 minutes or less, initial encounter: Secondary | ICD-10-CM

## 2018-09-25 NOTE — Telephone Encounter
Calvin Morris   [id number]   DOB: 15-Aug-1965 male Age: 53 y.o.    [place] [address]      PCP:   No Pcp, Na  Employer or Type of work:  Disabled    Spoke to the patient.  He is unsure if he has insurance at this time.  He was denied disability.  Patient declined an appointment for followup. He stated "I am not having any problems and am going to see Doctors here in [place]. I don't want to come to [facility]."  Advised patient that [facility] would be open to seeing the patient in the future if he decides.  Patient agreed to plan.

## 2018-09-26 ENCOUNTER — Encounter: Admit: 2018-09-26 | Discharge: 2018-09-26

## 2018-09-26 NOTE — Progress Notes
Order for elevated leg rests faxed to [facility] [phone]) per pt's request.

## 2018-09-27 ENCOUNTER — Encounter: Admit: 2018-09-27 | Discharge: 2018-09-27

## 2018-09-27 DIAGNOSIS — S32040D Wedge compression fracture of fourth lumbar vertebra, subsequent encounter for fracture with routine healing: Secondary | ICD-10-CM

## 2018-10-01 NOTE — Progress Notes
Date of Service: 10/02/2018      Chief Complaint:  Right ankle post operative visit  DOS:  09/21/18      History of Present Illness  Calvin Morris is 11 days post surgery. He denies post surgical fever or chills. Calvin Morris has remained NWB on the operative extremity and has been elevating for swelling control. He's been wearing his night splint.       No Known Allergies     Current Outpatient Medications on File Prior to Visit   Medication Sig Dispense Refill   ??? acetaminophen (TYLENOL) 325 mg tablet Take two tablets by mouth Morris 6 hours as needed.     ??? aspirin 81 mg chewable tablet Chew one tablet by mouth twice daily. Take with food. 90 tablet 0   ??? bacitracin 500 unit/g topical ointment Apply to facial lacerations twice daily until healed. 14 g 0   ??? calcium carbonate (TUMS) 500 mg (200 mg elemental calcium) chewable tablet Chew 500-1,000 mg by mouth Morris 6 hours as needed.     ??? omeprazole DR (PRILOSEC) 40 mg capsule Take 40 mg by mouth daily before breakfast.     ??? oxyCODONE (ROXICODONE) 5 mg tablet Take one tablet to three tablets by mouth Morris 4 hours as needed 40 tablet 0   ??? polyethylene glycol 3350 (MIRALAX) 17 g packet Take one packet by mouth daily as needed. 12 each    ??? senna (SENOKOT) 8.6 mg tablet Take one tablet by mouth twice daily. 90 tablet      No current facility-administered medications on file prior to visit.           Physical Exam  Calvin Morris appears well, normal mood and affect. Alert and oriented x3.         Ortho Exam  Today I examined the right lower extremity.  He's got significant swelling to the RLE. No erythema, incisions healing appropriately. Sensation intact throughout the foot.         Assessment and Plan    Calvin Morris is doing well 11 days post surgery. Post operative dressings were removed today without complication. Incisions are maturing at the expected rate. He will continue wearing his night splint and remain NWB. Elevate the extremity above heart level to reduce swelling. The patient was instructed to monitor signs of infection which include drainage, erythema, warmth and increased pain.  He was advised to remain strictly NWB on the operative leg. He should work hard on range of motion of his right knee. Driving is strongly discouraged at this time. Patient has verbalized understanding of education provided. We will provide him with Tubigrip for swelling.     Follow up in 2 weeks for suture removal and x-rays of his tibia     I attest that I, Rebekah Kasputis, ATC, have taken down these notes as a scribe in the presence of the dictating physician.  10/02/2018 2:40 PM    I attest that I, Ree Shay, MD, personally interviewed, examined the patient and performed the services documented above by the department scribe.

## 2018-10-02 ENCOUNTER — Encounter: Admit: 2018-10-02 | Discharge: 2018-10-02

## 2018-10-02 ENCOUNTER — Ambulatory Visit: Admit: 2018-10-02 | Discharge: 2018-10-03

## 2018-10-02 DIAGNOSIS — Z09 Encounter for follow-up examination after completed treatment for conditions other than malignant neoplasm: Secondary | ICD-10-CM

## 2018-10-16 ENCOUNTER — Encounter: Admit: 2018-10-16 | Discharge: 2018-10-16

## 2018-10-16 ENCOUNTER — Ambulatory Visit: Admit: 2018-10-16 | Discharge: 2018-10-16 | Payer: MEDICAID

## 2018-10-16 ENCOUNTER — Ambulatory Visit: Admit: 2018-10-16 | Discharge: 2018-10-16

## 2018-10-16 DIAGNOSIS — M25571 Pain in right ankle and joints of right foot: Secondary | ICD-10-CM

## 2018-10-16 DIAGNOSIS — G8929 Other chronic pain: Secondary | ICD-10-CM

## 2018-10-16 NOTE — Progress Notes
Calvin Morris returns today for evaluation after open reduction internal fixation of proximal tibia/shaft fracture.  He is doing well overall.  He has been nonweightbearing but does state that he occasionally puts his foot flat on the ground as he transfers on his of the leg.  He continues to smoke.  His wounds are well-healed.  There is no evidence of any sort drainage or erythema.  He does have some burning pain to the inner aspect of his thigh on the operative side.  He, of note, has sustained lumbar spine fracture.  He demonstrates nearly full extension is able to flex his knee to about 90 degrees today in clinic.  He has good range of motion of the ankle as well.  The radiographs demonstrate good alignment of his implants. We will plan to get his sutures out today.  We will continue his nonweightbearing at this time .  We will plan to see him back in 3 weeks with a repeat radiographs at that time.  We counseled him to cease smoking and to continue range of motion about the knee.

## 2018-10-18 ENCOUNTER — Encounter: Admit: 2018-10-18 | Discharge: 2018-10-18

## 2018-10-18 NOTE — Telephone Encounter
Received voicemail requesting pain med refill. Routing to provider for authorization.

## 2018-10-19 MED ORDER — OXYCODONE-ACETAMINOPHEN 7.5-325 MG PO TAB
1-2 | ORAL_TABLET | ORAL | 0 refills | 2.00000 days | Status: DC | PRN
Start: 2018-10-19 — End: 2018-11-01

## 2018-10-31 ENCOUNTER — Encounter: Admit: 2018-10-31 | Discharge: 2018-10-31

## 2018-10-31 DIAGNOSIS — M25571 Pain in right ankle and joints of right foot: Secondary | ICD-10-CM

## 2018-10-31 NOTE — Progress Notes
Date of Service: 11/01/2018      Status post: open reduction internal fixation of proximal tibia/shaft fracture.  (09/21/18)      Plan    Calvin Morris is about 6 weeks post surgery. Sutures were removed. The patient was instructed to monitor signs of infection which include drainage, erythema, warmth and increased pain. His x-rays are satisfactory. His hardware is in good position. I think I would send him for a sarmento type fracture brace. He has my permission to put some weight down on his foot with this. We discussed formal therapy, particulary for his knee. I will provide him with a prescription for this. He needs to continue to limit his tobacco use. He can bear 50% weight now while in the brace. Around the 1st of October, he can start to [facility].    Follow up around the 1st or 2nd week in October.       I attest that I, Rebekah Kasputis, ATC, have taken down these notes as a scribe in the presence of the dictating physician.  11/01/2018 3:04 PM    I attest that I, Leta Speller, MD, personally interviewed, examined the patient and performed the services documented above by the department scribe.

## 2018-11-01 ENCOUNTER — Encounter: Admit: 2018-11-01 | Discharge: 2018-11-01

## 2018-11-01 ENCOUNTER — Ambulatory Visit: Admit: 2018-11-01 | Discharge: 2018-11-01

## 2018-11-01 DIAGNOSIS — Z09 Encounter for follow-up examination after completed treatment for conditions other than malignant neoplasm: Secondary | ICD-10-CM

## 2018-11-01 DIAGNOSIS — M25571 Pain in right ankle and joints of right foot: Principal | ICD-10-CM

## 2018-11-01 DIAGNOSIS — G8929 Other chronic pain: Secondary | ICD-10-CM

## 2018-11-02 ENCOUNTER — Ambulatory Visit: Admit: 2018-11-02 | Discharge: 2018-11-02

## 2018-11-02 ENCOUNTER — Encounter: Admit: 2018-11-02 | Discharge: 2018-11-02

## 2018-11-02 DIAGNOSIS — S32040D Wedge compression fracture of fourth lumbar vertebra, subsequent encounter for fracture with routine healing: Principal | ICD-10-CM

## 2018-11-02 NOTE — Progress Notes
HPI:  I had the pleasure of seeing Calvin Morris in the office today. Patient presents to the office to review his most recent set of xrays. His imaging shows a stable L4 fracture. From our stadpoint he does not require intervention for this fracture. We will have him call us if he has questions or concerns.      Exam:  AA  Conversant  Speech fluent  MAEW with good strength though unable to test RLE fully due to injury  CN II-XII GI  Sensation GI      Plan:  Plan is for the patient to call with questions or concerns.      I spent 15 minutes in face to face contact. Over 50% of that time was spent in education and counseling. At the conclusion of our discussion she indicated that there were no further questions at this time.    Clydene Laming, MD  Assistant Professor, Department of Neurosurgery  [facility]

## 2018-11-06 MED ORDER — HYDROCODONE-ACETAMINOPHEN 7.5-325 MG PO TAB
1-2 | ORAL_TABLET | ORAL | 0 refills | 30.00000 days | Status: AC | PRN
Start: 2018-11-06 — End: ?

## 2018-12-14 ENCOUNTER — Encounter: Admit: 2018-12-14 | Discharge: 2018-12-14

## 2018-12-14 NOTE — Progress Notes
Place call to patient twice to have visit with Dr. Karsten Ro.  Patient was unavailable due to Voicemail was not set up and message could not be left for visit.  This appointment visit today was via telephone.  Patient not available.

## 2018-12-21 ENCOUNTER — Encounter: Admit: 2018-12-21 | Discharge: 2018-12-21

## 2018-12-21 DIAGNOSIS — S82201D Unspecified fracture of shaft of right tibia, subsequent encounter for closed fracture with routine healing: Secondary | ICD-10-CM

## 2018-12-21 DIAGNOSIS — Z09 Encounter for follow-up examination after completed treatment for conditions other than malignant neoplasm: Secondary | ICD-10-CM

## 2018-12-21 NOTE — Progress Notes
Orders for right tib/fib x-rays placed and faxed to [facility] (fax [phone]) per pt request. Will schedule telehealth visit once pt has x-rays completed. Cover sheet requesting images be clouded and report sent faxed.

## 2019-01-03 ENCOUNTER — Encounter: Admit: 2019-01-03 | Discharge: 2019-01-03

## 2019-01-03 NOTE — Telephone Encounter
Attempted to contact patient to see what recent imaging patient had. Epic only shows imaging from [place] prior to patient last appointment. Left voice mail with office contact for return call.Calvin Morris Shawnie Pons

## 2019-01-10 ENCOUNTER — Encounter: Admit: 2019-01-10 | Discharge: 2019-01-10

## 2019-01-10 NOTE — Telephone Encounter
Attempting to contact patient to see what recent imaging patient had. Patient has appointment today with Dr. Karsten Ro to review imaging. Last imaging in patient chart September 2020. No answer, left voice mail with office contact for patient to return call to our office.Mykeisha Dysert Shawnie Pons

## 2019-01-15 ENCOUNTER — Encounter: Admit: 2019-01-15 | Discharge: 2019-01-15

## 2019-01-15 NOTE — Telephone Encounter
Spoke with patient girlfriend Calvin Morris, she called requesting new Physical Therapy orders sent to [facility] prior to Wednesday, January 16, 2019. Spoke with Almyra Free RN, patient needs to contact Dr. Dina Rich office as they referred patient to Physical Therapy post surgery. Calvin Morris verbalized understanding, no more questions.Erial Fikes Shawnie Pons

## 2019-02-07 ENCOUNTER — Ambulatory Visit: Admit: 2019-02-07 | Discharge: 2019-02-07 | Payer: MEDICAID

## 2019-02-07 ENCOUNTER — Encounter: Admit: 2019-02-07 | Discharge: 2019-02-07

## 2019-02-07 DIAGNOSIS — S32040D Wedge compression fracture of fourth lumbar vertebra, subsequent encounter for fracture with routine healing: Secondary | ICD-10-CM

## 2019-03-15 ENCOUNTER — Encounter: Admit: 2019-03-15 | Discharge: 2019-03-15

## 2019-03-27 ENCOUNTER — Encounter: Admit: 2019-03-27 | Discharge: 2019-03-27

## 2019-03-27 DIAGNOSIS — S82201D Unspecified fracture of shaft of right tibia, subsequent encounter for closed fracture with routine healing: Secondary | ICD-10-CM

## 2019-03-28 ENCOUNTER — Encounter: Admit: 2019-03-28 | Discharge: 2019-03-28

## 2019-03-28 ENCOUNTER — Ambulatory Visit: Admit: 2019-03-28 | Discharge: 2019-03-28 | Payer: MEDICAID

## 2019-03-28 ENCOUNTER — Ambulatory Visit: Admit: 2019-03-28 | Discharge: 2019-03-28

## 2019-03-28 DIAGNOSIS — S82201D Unspecified fracture of shaft of right tibia, subsequent encounter for closed fracture with routine healing: Secondary | ICD-10-CM

## 2019-04-01 ENCOUNTER — Encounter: Admit: 2019-04-01 | Discharge: 2019-04-01
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
## 2021-03-09 ENCOUNTER — Encounter: Admit: 2021-03-09 | Discharge: 2021-03-09

## 2021-03-18 ENCOUNTER — Encounter: Admit: 2021-03-18 | Discharge: 2021-03-18

## 2021-03-18 NOTE — Telephone Encounter
Received call from pt's fiance, Calvin Morris stating pt's has loose hardware in his right ankle and right knee that restricts him from walking. He is now in a wheelchair. Calvin Morris is [facility] to [facility]. She was informed Calvin Morris has to comply with the proper channels within the prison ([place]) for medical care and request an appt with Dr. Wilkie Aye. She verbalized understanding.

## 2021-04-28 ENCOUNTER — Encounter: Admit: 2021-04-28 | Discharge: 2021-04-28 | Payer: Commercial Managed Care - PPO

## 2021-06-24 ENCOUNTER — Encounter: Admit: 2021-06-24 | Discharge: 2021-06-24 | Payer: Commercial Managed Care - PPO | Primary: Geriatric Medicine

## 2021-07-07 ENCOUNTER — Encounter: Admit: 2021-07-07 | Discharge: 2021-07-07 | Payer: Commercial Managed Care - PPO | Primary: Geriatric Medicine

## 2021-08-04 ENCOUNTER — Encounter: Admit: 2021-08-04 | Discharge: 2021-08-04 | Payer: Commercial Managed Care - PPO | Primary: Geriatric Medicine

## 2021-12-13 ENCOUNTER — Encounter: Admit: 2021-12-13 | Discharge: 2021-12-13 | Payer: Commercial Managed Care - PPO | Primary: Geriatric Medicine

## 2021-12-22 ENCOUNTER — Ambulatory Visit: Admit: 2021-12-22 | Discharge: 2021-12-22 | Payer: Commercial Managed Care - PPO | Primary: Geriatric Medicine

## 2021-12-22 DIAGNOSIS — K219 Gastro-esophageal reflux disease without esophagitis: Secondary | ICD-10-CM

## 2021-12-22 NOTE — Progress Notes
Spoke with Jillyn Hidden, RN at [facility], Confirmed procedure date and time. Reviewed procedure/prep instructions, meds and need for a driver. Also discussed driver/visitor policy. He verbalized understanding. Instructions faxed to [phone].          EGD (ESOPHAGOGASTRODUODENOSCOPY) PREP      Upper GI endoscopy allows healthcare providers to look directly into the beginning of your gastrointestinal(GI) tract.  The esophagus, stomach, and duodenum (first part of the small intestine) make up the upper GI tract.      1 Week Prior:  If you are taking a weekly GLP-1 agonist (ex. Ozempic, Mounjaro)  do not take this medication the week prior.     5 Days Prior:  Check with your prescribing physician for instructions about stopping your blood thinner.  Examples of blood thinners are Aleve, Aspirin. Coumadin, Eliquis, Ibuprofen, Naproxen, Plavix, and Xarelto.  Do not give yourself a Lovenox injection the morning of the test. Lovenox injections may be taken as usual through the day before your test.    Day of Exam:  Do not eat or drink anything after midnight the night before your exam. However, if your exam is in the afternoon you may drink clear liquids only up until (4) hours before your scheduled procedure time.  After this, you should have nothing by mouth.  This includes GUM or CANDY.   Chewing tobacco must be stopped  (6) hours before your scheduled procedure.   If you have an early morning test, take ONLY your essential morning medications (heart, blood pressure, seizure, etc.) with a small sip of water.   You will be sedated for the procedure. A responsible adult must drive you home (no Benedetto Goad, taxis, or buses are permitted). If you do not have a driver we will be unable to do the test.   If you are taking a daily GLP-1 agonist (ex. Ozempic, Mounjaro)  do not take this medication the day of the test.   You will be here for (3-4) hours from arrival time.   You will not be able to return to work the same day.   Please bring a list of your current medications and the dosages with you.     The Procedure:  You will lie on the endoscopy table. Usually patients lie on the left side.  You will be monitored and given oxygen.   You are given sedation (relaxing) medication through an intravenous (IV) line.  The healthcare provider will put the endoscope in your mouth and down your esophagus. It is thinner than most pieces of food that you swallow.  It will not affect your breathing. The medicine helps keep you from gagging.   Air is inserted to expand your GI tract. It can make you burp.  During the procedure, the healthcare provider can take biopsies (tissue samples), remove abnormalities such as polyps, or treat abnormalities though a variety of devices placed through the endoscope. You will not feel this.   The endoscope carries images of your upper GI tract to a video screen.  An adult must drive you home.

## 2021-12-31 ENCOUNTER — Ambulatory Visit: Admit: 2021-12-31 | Discharge: 2021-12-31 | Payer: Commercial Managed Care - PPO | Primary: Geriatric Medicine

## 2021-12-31 ENCOUNTER — Encounter: Admit: 2021-12-31 | Discharge: 2021-12-31 | Payer: Commercial Managed Care - PPO | Primary: Geriatric Medicine

## 2021-12-31 DIAGNOSIS — K219 Gastro-esophageal reflux disease without esophagitis: Secondary | ICD-10-CM

## 2021-12-31 MED ORDER — PROPOFOL INJ 10 MG/ML IV VIAL
INTRAVENOUS | 0 refills | Status: DC
Start: 2021-12-31 — End: 2021-12-31

## 2021-12-31 MED ORDER — FENTANYL CITRATE (PF) 50 MCG/ML IJ SOLN
INTRAVENOUS | 0 refills | Status: DC
Start: 2021-12-31 — End: 2021-12-31

## 2021-12-31 MED ORDER — PROPOFOL 10 MG/ML IV EMUL 20 ML (INFUSION)(AM)(OR)
INTRAVENOUS | 0 refills | Status: DC
Start: 2021-12-31 — End: 2021-12-31

## 2021-12-31 MED ORDER — LIDOCAINE (PF) 20 MG/ML (2 %) IJ SOLN
INTRAVENOUS | 0 refills | Status: DC
Start: 2021-12-31 — End: 2021-12-31

## 2021-12-31 MED ADMIN — LACTATED RINGERS IV SOLP [4318]: 1000.000 mL | INTRAVENOUS | @ 18:00:00 | Stop: 2021-12-31 | NDC 00338011704

## 2021-12-31 NOTE — Anesthesia Post-Procedure Evaluation
Post-Anesthesia Evaluation    Name: Calvin Morris      MRN: [id number]     DOB: [birth year]     Age: 56 y.o.     Sex: male   __________________________________________________________________________     Procedure Information     Anesthesia Start Date/Time: 12/31/21 1325    Procedure: ESOPHAGOGASTRODUODENOSCOPY WITH SPECIMEN COLLECTION BY BRUSHING/ WASHING    Location: [facility] / [facility]    Surgeons: Felicita Gage, MD          Post-Anesthesia Vitals  BP: 134/97 (11/03 1400)  Temp: 36.8 C (98.3 F) (11/03 1339)  Pulse: 76 (11/03 1400)  Respirations: 14 PER MINUTE (11/03 1400)  SpO2: 97 % (11/03 1400)  O2 Device: None (Room air) (11/03 1400)   Vitals Value Taken Time   BP 134/97 12/31/21 1400   Temp 36.8 C (98.3 F) 12/31/21 1339   Pulse 76 12/31/21 1400   Respirations 14 PER MINUTE 12/31/21 1400   SpO2 97 % 12/31/21 1400   O2 Device None (Room air) 12/31/21 1400   ABP     ART BP           Post Anesthesia Evaluation Note    Evaluation location: pre/post  Patient participation: recovered; patient participated in evaluation  Level of consciousness: alert    Pain score: 0  Pain management: adequate    Hydration: normovolemia  Temperature: 36.0C - 38.4C  Airway patency: adequate    Perioperative Events       Post-op nausea and vomiting: no PONV    Postoperative Status  Cardiovascular status: hemodynamically stable  Respiratory status: spontaneous ventilation        Perioperative Events  There were no known notable events for this encounter.

## 2022-01-01 ENCOUNTER — Encounter: Admit: 2022-01-01 | Discharge: 2022-01-01 | Payer: Commercial Managed Care - PPO | Primary: Geriatric Medicine

## 2022-01-01 DIAGNOSIS — K219 Gastro-esophageal reflux disease without esophagitis: Secondary | ICD-10-CM
